# Patient Record
Sex: Female | Born: 1960 | Hispanic: No | Marital: Married | Smoking: Former smoker
Health system: Southern US, Community
[De-identification: ages and names within clinical notes are randomized; demographics above are authoritative.]

## PROBLEM LIST (undated history)

## (undated) DIAGNOSIS — E7800 Pure hypercholesterolemia, unspecified: Secondary | ICD-10-CM

## (undated) DIAGNOSIS — I10 Essential (primary) hypertension: Secondary | ICD-10-CM

## (undated) DIAGNOSIS — N189 Chronic kidney disease, unspecified: Secondary | ICD-10-CM

## (undated) HISTORY — PX: TUBAL LIGATION: SHX77

## (undated) HISTORY — DX: Pure hypercholesterolemia, unspecified: E78.00

## (undated) HISTORY — DX: Essential (primary) hypertension: I10

---

## 2000-03-16 ENCOUNTER — Other Ambulatory Visit: Admission: RE | Admit: 2000-03-16 | Discharge: 2000-03-16 | Payer: Self-pay | Admitting: Obstetrics and Gynecology

## 2001-03-19 ENCOUNTER — Other Ambulatory Visit: Admission: RE | Admit: 2001-03-19 | Discharge: 2001-03-19 | Payer: Self-pay | Admitting: Obstetrics and Gynecology

## 2005-12-22 ENCOUNTER — Other Ambulatory Visit: Admission: RE | Admit: 2005-12-22 | Discharge: 2005-12-22 | Payer: Self-pay | Admitting: Family Medicine

## 2007-01-02 ENCOUNTER — Other Ambulatory Visit: Admission: RE | Admit: 2007-01-02 | Discharge: 2007-01-02 | Payer: Self-pay | Admitting: Family Medicine

## 2007-11-27 ENCOUNTER — Inpatient Hospital Stay (HOSPITAL_COMMUNITY): Admission: AD | Admit: 2007-11-27 | Discharge: 2007-11-27 | Payer: Self-pay | Admitting: Obstetrics and Gynecology

## 2011-05-18 ENCOUNTER — Other Ambulatory Visit: Payer: Self-pay | Admitting: Family Medicine

## 2011-05-18 DIAGNOSIS — Z1231 Encounter for screening mammogram for malignant neoplasm of breast: Secondary | ICD-10-CM

## 2011-05-20 ENCOUNTER — Ambulatory Visit
Admission: RE | Admit: 2011-05-20 | Discharge: 2011-05-20 | Disposition: A | Discharge status: Home / Self Care | Payer: BC Managed Care – PPO | Source: Ambulatory Visit | Attending: Family Medicine | Admitting: Family Medicine

## 2011-05-20 DIAGNOSIS — Z1231 Encounter for screening mammogram for malignant neoplasm of breast: Secondary | ICD-10-CM

## 2011-07-08 LAB — URINE MICROSCOPIC-ADD ON

## 2011-07-08 LAB — DIFFERENTIAL
Band Neutrophils: 20 — ABNORMAL HIGH
Blasts: 0
Lymphocytes Relative: 8 — ABNORMAL LOW
Lymphs Abs: 1.6
Monocytes Absolute: 0.8
Monocytes Relative: 4
Neutro Abs: 13.7 — ABNORMAL HIGH
Neutrophils Relative %: 68
Promyelocytes Absolute: 0
nRBC: 0

## 2011-07-08 LAB — URINALYSIS, ROUTINE W REFLEX MICROSCOPIC
Glucose, UA: NEGATIVE
Specific Gravity, Urine: 1.02
pH: 6.5

## 2011-07-08 LAB — CBC
Hemoglobin: 12.1
Platelets: 237
RDW: 14

## 2011-07-08 LAB — URINE CULTURE

## 2011-12-01 ENCOUNTER — Other Ambulatory Visit: Payer: Self-pay | Admitting: Urology

## 2011-12-13 ENCOUNTER — Encounter (HOSPITAL_COMMUNITY): Payer: Self-pay | Admitting: *Deleted

## 2011-12-13 NOTE — Progress Notes (Signed)
Reminded no Aspirin products 4 days prior to procedure

## 2011-12-15 ENCOUNTER — Encounter (HOSPITAL_COMMUNITY): Payer: Self-pay | Admitting: Pharmacy Technician

## 2011-12-16 NOTE — H&P (Signed)
Chief Complaint     Left distal ureter stone.  History of Present Illness     51 year old female with a left distal 8 mm ureteral stone. This was discovered on CT scan 11/24/11.  The stone was visible on KUB 11/25/11.  She presents for left SWL.  We have discussed the risks, benefits, alternatives, and likelihood of achieving her goals. UA on 11/25/11 negative for signs of infection.  Past Medical History Problems  1. History of  No Medical Problems  Surgical History Problems  1. History of  Tubal Ligation V25.2  Current Meds 1. Multiple Vitamin TABS; Therapy: (Recorded:07Feb2013) to  Allergies Medication  1. Penicillins  Family History Problems  1. Paternal history of  Death In The Family Father 24- Renal failure 2. Family history of  Diabetes Mellitus V18.0 3. Paternal history of  Exposed To Chemical Contaminants - Dioxin 4. Maternal history of  Family Health Status - Mother's Age 83. Family history of  Family Health Status Number Of Children 1 son & 2 daughters 6. Paternal history of  Renal Failure  Social History Problems    Alcohol Use 3 on wk-ends   Caffeine Use 3 cups coffee daily   Former Smoker V15.82 2 yrs in college; not since 1984   Marital History - Currently Married   Occupation: support at [employer]  Review of Systems Constitutional, skin, eye, otolaryngeal, hematologic/lymphatic, cardiovascular, pulmonary, endocrine, musculoskeletal, gastrointestinal, neurological and psychiatric system(s) were reviewed and pertinent findings if present are noted.  Gastrointestinal: diarrhea and melena.     Physical Exam Constitutional: Well nourished and well developed.  ENT:. The ears and nose are normal in appearance. The oropharynx is normal.  Neck: The appearance of the neck is normal and no neck mass is present.  Pulmonary: No respiratory distress and normal respiratory rhythm and effort.  Cardiovascular: Heart rate and rhythm are normal . No  peripheral edema.  Abdomen: The abdomen is soft and nontender. The abdomen is no rebound. No CVA tenderness.  Genitourinary: Examination of the external genitalia shows normal female external genitalia and no vulvar atrophy. The urethra is normal in appearance. There is no urethral mass. Vaginal exam demonstrates no abnormalities, no atrophy and no discharge. No cystocele is identified. No rectocele is identified. The bladder is normal on palpation and non tender. The anus is normal on inspection. The perineum is normal on inspection. No perineal tenderness is present.  Lymphatics: The posterior cervical and supraclavicular nodes are not enlarged or tender.  Skin: Normal skin turgor and no visible rash.  Neuro/Psych:. Mood and affect are appropriate. No focal sensory deficits.     Assessment Left distal ureter   Plan     Proceed with left SWL.

## 2011-12-19 ENCOUNTER — Encounter (HOSPITAL_COMMUNITY): Payer: Self-pay | Admitting: *Deleted

## 2011-12-19 ENCOUNTER — Ambulatory Visit (HOSPITAL_COMMUNITY): Payer: BC Managed Care – PPO

## 2011-12-19 ENCOUNTER — Ambulatory Visit (HOSPITAL_COMMUNITY)
Admission: RE | Admit: 2011-12-19 | Discharge: 2011-12-19 | Disposition: A | Discharge status: Home / Self Care | Payer: BC Managed Care – PPO | Source: Ambulatory Visit | Attending: Urology | Admitting: Urology

## 2011-12-19 ENCOUNTER — Encounter (HOSPITAL_COMMUNITY)
Admission: RE | Disposition: A | Discharge status: Home / Self Care | Payer: Self-pay | Source: Ambulatory Visit | Attending: Urology

## 2011-12-19 DIAGNOSIS — N201 Calculus of ureter: Secondary | ICD-10-CM | POA: Insufficient documentation

## 2011-12-19 HISTORY — DX: Chronic kidney disease, unspecified: N18.9

## 2011-12-19 SURGERY — LITHOTRIPSY, ESWL
Anesthesia: LOCAL | Laterality: Left

## 2011-12-19 MED ORDER — CIPROFLOXACIN HCL 500 MG PO TABS
500.0000 mg | ORAL_TABLET | ORAL | Status: AC
  Administered 2011-12-19: 500 mg via ORAL

## 2011-12-19 MED ORDER — DEXTROSE-NACL 5-0.45 % IV SOLN
INTRAVENOUS | Status: DC
  Administered 2011-12-19: 15:00:00 via INTRAVENOUS

## 2011-12-19 MED ORDER — DIPHENHYDRAMINE HCL 25 MG PO CAPS
25.0000 mg | ORAL_CAPSULE | ORAL | Status: AC
  Administered 2011-12-19: 25 mg via ORAL

## 2011-12-19 MED ORDER — DIAZEPAM 5 MG PO TABS
10.0000 mg | ORAL_TABLET | ORAL | Status: AC
  Administered 2011-12-19: 10 mg via ORAL

## 2011-12-19 NOTE — Discharge Instructions (Signed)
DISCHARGE INSTRUCTIONS FOR STONES   MEDICATIONS:   1. DO NOT RESUME YOUR ASPIRIN, or any other medicines like ibuprofen, motrin, excedrin, advil, aleve, vitamin E, fish oil as these can all cause bleeding x 7 days.  ACTIVITY 1. No strenuous activity x 1 day. 2. No driving while on narcotic pain medications 3. Drink plenty of water 4. Continue to walk at home - you can still get blood clots when you are at home, so keep active, but don't over do it. 5. May return to work in 1 day.  BATHING 1. You can shower or take a bath.  SIGNS/SYMPTOMS TO CALL: 1. Please call us if you have a fever greater than 101.5, uncontrolled  nausea/vomiting, uncontrolled pain, dizziness, unable to urinate, chest pain, shortness of breath, leg swelling, leg pain, redness around wound, drainage from wound, or any other concerns or questions.  You can reach [place] at [phone].  Lithotripsy Care After Refer to this sheet for the next few weeks. These discharge instructions provide you with general information on caring for yourself after you leave the hospital. Your caregiver may also give you specific instructions. Your treatment has been planned according to the most current medical practices available, but unavoidable complications sometimes occur. If you have any problems or questions after discharge, please call your caregiver. AFTER THE PROCEDURE   The recovery time will vary with the procedure done.   You will be taken to the recovery area. A nurse will watch and check your progress. Once you are awake, stable, and taking fluids well, you will be allowed to go home as long as there are no problems.   Your urine may have a red tinge for a few days after treatment. Blood loss is usually minimal.   You may have soreness in the back or flank area. This usually goes away after a few days. The procedure can cause blotches or bruises on the back where the pressure wave enters the skin. These marks usually cause  only minimal discomfort and should disappear in a short time.   Stone fragments should begin to pass within 24 hours of treatment. However, a delayed passage is not unusual.   You may have pain, discomfort, and feel sick to your stomach (nauseous) when the crushed (pulverized) fragments of stone are passed down the tube from the kidney to the bladder. Stone fragments can pass soon after the procedure and may last for up to 4 to 8 weeks.   A small number of patients may have severe pain when stone fragments are not able to pass, which leads to an obstruction.   If your stone is greater than 1 inch/2.5 centimeters in diameter or if you have multiple stones that have a combined diameter greater than 1 inch/2.5 centimeters, you may require more than 1 treatment.   You must have someone drive you home.  HOME CARE INSTRUCTIONS   Rest at home until you feel your energy improving.   Only take over-the-counter or prescription medicines for pain, discomfort, or fever as directed by your caregiver. Depending on the type of lithotripsy, you may need to take medicines (antibiotics) that kill germs and anti-inflammatory medicines for a few days.   Drink enough water and fluids to keep your urine clear or pale yellow. This helps "flush" your kidneys. It helps pass any remaining pieces of stone and prevents stones from coming back.   Most people can resume daily activities within 1 or 2 days after standard lithotripsy. It can  take longer to recover from laser and percutaneous lithotripsy.   If the stones are in your urinary system, you may be asked to strain your urine at home to look for stones. Any stones that are found can be sent to a medical lab for examination.   Visit your caregiver for a follow-up appointment in a few weeks. Your doctor may remove your stent if you have one. Your caregiver will also check to see whether stone particles still remain.  SEEK MEDICAL CARE IF:   You have an oral  temperature above 102 F (38.9 C).   Your pain is not relieved by medicine.   You have a lasting nauseous feeling.   You feel there is too much blood in the urine.   You develop persistent problems with frequent and/or painful urination that does not at least partially improve after 2 days following the procedure.   You have a congested cough.   You feel lightheaded.   You develop a rash or any other signs that might suggest an allergic problem.   You develop any reaction or side effects to your medicine(s).  SEEK IMMEDIATE MEDICAL CARE IF:   You experience severe back and/or flank pain.   You see nothing but blood when you urinate.   You cannot pass any urine at all.   You have an oral temperature above 102 F (38.9 C), not controlled by medicine.   You develop shortness of breath, difficulty breathing, or chest pain.   You develop vomiting that will not stop after 6 to 8 hours.   You have a fainting episode.  MAKE SURE YOU:   Understand these instructions.   Will watch your condition.   Will get help right away if you are not doing well or get worse.  Document Released: 10/23/2007 Document Revised: 09/22/2011 Document Reviewed: 10/23/2007 [facility] Patient Information 2012 [place], [place].

## 2011-12-19 NOTE — Brief Op Note (Signed)
12/19/2011  3:52 PM  PATIENT:  Emily Lam  51 y.o. female  PRE-OPERATIVE DIAGNOSIS:  Left Distal Ureter Stone  POST-OPERATIVE DIAGNOSIS: Left distal ureter stone  PROCEDURE:  Procedure(s) (LRB): EXTRACORPOREAL SHOCK WAVE LITHOTRIPSY (ESWL) (Left)  SURGEON:  Surgeon(s) and Role:    * Milford Cage, MD - Primary  PHYSICIAN ASSISTANT:   ASSISTANTS: none   ANESTHESIA:   IV sedation  EBL:     BLOOD ADMINISTERED:none  DRAINS: none   LOCAL MEDICATIONS USED:  NONE  SPECIMEN:  No Specimen  DISPOSITION OF SPECIMEN:  N/A  COUNTS:  YES  TOURNIQUET:  * No tourniquets in log *  DICTATION: .Note written in paper chart  PLAN OF CARE: Discharge to home after PACU  PATIENT DISPOSITION:  PACU - hemodynamically stable.   Delay start of Pharmacological VTE agent (>24hrs) due to surgical blood loss or risk of bleeding: yes

## 2011-12-20 ENCOUNTER — Encounter (HOSPITAL_COMMUNITY): Payer: Self-pay

## 2014-10-17 HISTORY — PX: AUGMENTATION MAMMAPLASTY: SUR837

## 2014-10-17 HISTORY — PX: PLACEMENT OF BREAST IMPLANTS: SHX6334

## 2015-07-29 ENCOUNTER — Other Ambulatory Visit: Payer: Self-pay

## 2015-07-29 DIAGNOSIS — Z1231 Encounter for screening mammogram for malignant neoplasm of breast: Secondary | ICD-10-CM

## 2015-08-26 ENCOUNTER — Ambulatory Visit: Payer: BC Managed Care – PPO

## 2016-11-18 ENCOUNTER — Other Ambulatory Visit: Payer: Self-pay | Admitting: Family Medicine

## 2016-11-18 DIAGNOSIS — Z1231 Encounter for screening mammogram for malignant neoplasm of breast: Secondary | ICD-10-CM

## 2016-11-21 ENCOUNTER — Emergency Department (HOSPITAL_COMMUNITY)
Admission: EM | Admit: 2016-11-21 | Discharge: 2016-11-21 | Disposition: A | Discharge status: Home / Self Care | Payer: BLUE CROSS/BLUE SHIELD | Attending: Emergency Medicine | Admitting: Emergency Medicine

## 2016-11-21 ENCOUNTER — Encounter (HOSPITAL_COMMUNITY): Payer: Self-pay

## 2016-11-21 DIAGNOSIS — I10 Essential (primary) hypertension: Secondary | ICD-10-CM

## 2016-11-21 DIAGNOSIS — F419 Anxiety disorder, unspecified: Secondary | ICD-10-CM | POA: Diagnosis not present

## 2016-11-21 DIAGNOSIS — R9431 Abnormal electrocardiogram [ECG] [EKG]: Secondary | ICD-10-CM | POA: Diagnosis present

## 2016-11-21 DIAGNOSIS — I129 Hypertensive chronic kidney disease with stage 1 through stage 4 chronic kidney disease, or unspecified chronic kidney disease: Secondary | ICD-10-CM | POA: Insufficient documentation

## 2016-11-21 DIAGNOSIS — N189 Chronic kidney disease, unspecified: Secondary | ICD-10-CM | POA: Diagnosis not present

## 2016-11-21 DIAGNOSIS — R4589 Other symptoms and signs involving emotional state: Secondary | ICD-10-CM

## 2016-11-21 LAB — I-STAT TROPONIN, ED: TROPONIN I, POC: 0 ng/mL (ref 0.00–0.08)

## 2016-11-21 LAB — I-STAT CHEM 8, ED
BUN: 13 mg/dL (ref 6–20)
CREATININE: 0.6 mg/dL (ref 0.44–1.00)
Calcium, Ion: 1.12 mmol/L — ABNORMAL LOW (ref 1.15–1.40)
Chloride: 107 mmol/L (ref 101–111)
Glucose, Bld: 100 mg/dL — ABNORMAL HIGH (ref 65–99)
HEMATOCRIT: 48 % — AB (ref 36.0–46.0)
HEMOGLOBIN: 16.3 g/dL — AB (ref 12.0–15.0)
POTASSIUM: 3.8 mmol/L (ref 3.5–5.1)
Sodium: 143 mmol/L (ref 135–145)
TCO2: 27 mmol/L (ref 0–100)

## 2016-11-21 NOTE — ED Provider Notes (Signed)
[facility]-EMERGENCY DEPT Provider Note   CSN: [id number] Arrival date & time: 11/21/16  1245     History   Chief Complaint Chief Complaint  Patient presents with  . Abnormal ECG    HPI Emily Lam is a 56 y.o. female with no significant PMH. Today, she presented to the urgent care with a self-measured blood pressure of 200/90 after telling a co-worker about stressful events of the weekend. There, an EKG was done which apparently showed some non-specific changes that concerned the provider, so the pt was sent here to the ED via EMS. Pt describes increased psychosocial stress for the past couple of weeks with associated transient increases in her BP during that time. Pt denies headache, syncope/pre-syncope, visual disturbances, recent infection, chest pain, palpitations, dyspnea, orthopnea, DOE, and PND.  HPI  Past Medical History:  Diagnosis Date  . Chronic kidney disease    left distal ureter stone    There are no active problems to display for this patient.   Past Surgical History:  Procedure Laterality Date  . AUGMENTATION MAMMAPLASTY     20 yr ago  . TUBAL LIGATION      OB History    No data available       Home Medications    Prior to Admission medications   Not on File    Family History History reviewed. No pertinent family history.  Social History Social History  Substance Use Topics  . Smoking status: Never Smoker  . Smokeless tobacco: Never Used  . Alcohol use Yes     Allergies   Penicillins   Review of Systems Review of Systems  Ten systems reviewed and are negative for acute change, except as noted in the HPI.   Physical Exam Updated Vital Signs BP 165/86   Pulse 90   Temp 98.7 F (37.1 C) (Oral)   Resp 12   SpO2 95%   Physical Exam  Constitutional: She is oriented to person, place, and time. She appears well-developed and well-nourished. No distress.  HENT:  Head: Normocephalic and atraumatic.  Eyes: Conjunctivae are normal.  No scleral icterus.  Neck: Normal range of motion.  Cardiovascular: Normal rate, regular rhythm and normal heart sounds.  Exam reveals no gallop and no friction rub.   No murmur heard. Pulmonary/Chest: Effort normal and breath sounds normal. No respiratory distress.  Abdominal: Soft. Bowel sounds are normal. She exhibits no distension and no mass. There is no tenderness. There is no guarding.  Neurological: She is alert and oriented to person, place, and time.  Skin: Skin is warm and dry. She is not diaphoretic.  Nursing note and vitals reviewed.    ED Treatments / Results  Labs (all labs ordered are listed, but only abnormal results are displayed) Labs Reviewed  I-STAT CHEM 8, ED - Abnormal; Notable for the following:       Result Value   Glucose, Bld 100 (*)    Calcium, Ion 1.12 (*)    Hemoglobin 16.3 (*)    HCT 48.0 (*)    All other components within normal limits  I-STAT TROPOININ, ED    EKG  EKG Interpretation  Date/Time:  Monday November 21 2016 12:55:50 EST Ventricular Rate:  91 PR Interval:  134 QRS Duration: 74 QT Interval:  362 QTC Calculation: 445 R Axis:   87 Text Interpretation:  Normal sinus rhythm Possible Left atrial enlargement Left ventricular hypertrophy with repolarization abnormality Abnormal ECG Confirmed by Fayrene Fearing  MD, MARK ([id number]) on 11/21/2016 3:15:16 PM  Radiology No results found.  Procedures Procedures (including critical care time)  Medications Ordered in ED Medications - No data to display   Initial Impression / Assessment and Plan / ED Course  I have reviewed the triage vital signs and the nursing notes.  Pertinent labs & imaging results that were available during my care of the patient were reviewed by me and considered in my medical decision making (see chart for details).     Patient noted to be hypertensive in the emergency department.  No signs of hypertensive urgency.  Discussed with patient the need for close follow-up  and management by their primary care physician.    Final Clinical Impressions(s) / ED Diagnoses   Final diagnoses:  Hypertension, unspecified type  Feeling anxious    New Prescriptions New Prescriptions   No medications on file     Arthor Captainbigail Cait Locust, PA-C 11/21/16 1537    Rolland PorterMark James, MD 12/03/16 2259

## 2016-11-21 NOTE — ED Triage Notes (Signed)
Pt from doctors office for high blood pressure and abnormal EKG. Pt has no pain and ha not had any pain the whole time. Pt states she is relaxed now.

## 2016-11-21 NOTE — Discharge Instructions (Signed)
Contact a health care provider if: You think you are having a reaction to medicines taken. You have recurrent headaches or feel dizzy. You have swelling in your ankles. You have trouble with your vision. Get help right away if: You develop a severe headache or confusion. You have unusual weakness, numbness, or feel faint. You have severe chest or abdominal pain. You vomit repeatedly. You have trouble breathing.

## 2016-11-23 ENCOUNTER — Ambulatory Visit
Admission: RE | Admit: 2016-11-23 | Discharge: 2016-11-23 | Disposition: A | Discharge status: Home / Self Care | Payer: BLUE CROSS/BLUE SHIELD | Source: Ambulatory Visit | Attending: Family Medicine | Admitting: Family Medicine

## 2016-11-23 DIAGNOSIS — Z1231 Encounter for screening mammogram for malignant neoplasm of breast: Secondary | ICD-10-CM

## 2017-04-11 ENCOUNTER — Ambulatory Visit (INDEPENDENT_AMBULATORY_CARE_PROVIDER_SITE_OTHER): Payer: BLUE CROSS/BLUE SHIELD | Admitting: Neurology

## 2017-04-11 ENCOUNTER — Encounter: Payer: Self-pay | Admitting: Neurology

## 2017-04-11 VITALS — BP 157/83 | HR 93 | Ht 66.0 in | Wt 131.4 lb

## 2017-04-11 DIAGNOSIS — IMO0001 Reserved for inherently not codable concepts without codable children: Secondary | ICD-10-CM

## 2017-04-11 DIAGNOSIS — R209 Unspecified disturbances of skin sensation: Secondary | ICD-10-CM

## 2017-04-11 NOTE — Patient Instructions (Signed)

## 2017-04-11 NOTE — Progress Notes (Signed)
[facility]    Provider:  Dr Lucia Gaskins Referring Provider: Jesse Fall Primary Care Physician:  Sigmund Hazel, MD, Marcelo Baldy, PA-C  CC:  numbness  HPI:  Emily Lam is a 56 y.o. female here as a referral from Dr. Damaris Schooner for paresthesias. Past medical history of UTI and kidney stones and paresthesia, stress, hypertension, anxiety, hyperlipidemia, panic attacks. Per a review of primary care records: Patient has tingling in the back of the arms back, legs which comes and goes. Reaction from hydrochlorothiazide which started February 2018 and stopped the patient is also extremely stressed. Patient has recent stress in her life including her mom who is unwell. Exam and primary care office was normal including general, eyes, head, ears, nose, throat, neck, lungs, heart, extremities, neurologic.  Patient is here alone and reports she has had high blood pressure and she has anxiety and white coat syndrome and diagnosed with high BP due to stress. She started eating well and walking. She was walking and she would feel stress in the top middle of her back, walking helped with stress and sleeping. She started walking twice a week. She started watching with better posture and it felt better. She had a massage a week ago and there were lots of knots and she has "overworked something". She does lift packages during the day. Her back started tingling in the middle of march all over in the toe or lip or anywhere on the body felt like a "ping". Symptoms would happen occasionally in the beginning. Now she has some tingling maybe daily, she has some feeling of her lip and leg going numb, more on the left side not on the rgiht. It is random. No ain, split second feeling or a "swoosh" down her arms. Also in her face. The symptoms are not going away. Strecthing and brething helps and she has spome back muscular tightness on the left side of her thorax. No weakness, no vision changes, no  dizziness, no headaches, no fevers, no rash no other associated symptoms. No other focal neurologic deficits, associated symptoms, inciting events or modifiable factors. She has been very stressed and worried.   Reviewed notes, labs and imaging from outside physicians, which showed:   TSH 4.43, CMP unremarkable with BUN 8 and creatinine 0.68, vitamin B12 was 684 labs were drawn 03/27/2017  Review of Systems: Patient complains of symptoms per HPI as well as the following symptoms: no CP, no SOB, . Pertinent negatives and positives per HPI. All others negative.   Social History   Social History  . Marital status: Married    Spouse name: N/A  . Number of children: 3  . Years of education: 70   Occupational History  . [employer]    Social History Main Topics  . Smoking status: Never Smoker  . Smokeless tobacco: Never Used  . Alcohol use Yes     Comment: Up to 6 drinks on the weekend  . Drug use: No  . Sexual activity: Not on file   Other Topics Concern  . Not on file   Social History Narrative   Lives at home w/ her husband   Right-handed   Caffeine: 2 cups of coffee per day    Family History  Problem Relation Age of Onset  . Diabetes Father        Agent orange  . Neuropathy Father   . Breast cancer Maternal Grandmother     Past Medical History:  Diagnosis Date  .  Chronic kidney disease    left distal ureter stone  . High cholesterol   . Hypertension     Past Surgical History:  Procedure Laterality Date  . AUGMENTATION MAMMAPLASTY Bilateral 10/2014   20 yr ago Replaced with saline two years ago Jan of 2016  . PLACEMENT OF BREAST IMPLANTS Bilateral 10/2014   Behind the mucsle implants done in 10/2014 which replaced the In front of the muscle silicone gel implants done in March of 1991  . TUBAL LIGATION      Current Outpatient Prescriptions  Medication Sig Dispense Refill  . Cyanocobalamin (B-12 PO) Take by mouth daily.    Marland Kitchen loratadine (CLARITIN) 10 MG  tablet Take 10 mg by mouth daily.    Marland Kitchen ALPRAZolam (XANAX) 0.25 MG tablet TAKE 1 TABLET AS NEEDED TWICE A DAY  0   No current facility-administered medications for this visit.     Allergies as of 04/11/2017  . (No Known Allergies)    Vitals: BP (!) 157/83   Pulse 93   Ht 5\' 6"  (1.676 m)   Wt 131 lb 6.4 oz (59.6 kg)   BMI 21.21 kg/m  Last Weight:  Wt Readings from Last 1 Encounters:  04/11/17 131 lb 6.4 oz (59.6 kg)   Last Height:   Ht Readings from Last 1 Encounters:  04/11/17 5\' 6"  (1.676 m)    Physical exam: Exam: Gen: NAD, conversant, well nourised, well groomed                     CV: RRR, no MRG. No Carotid Bruits. No peripheral edema, warm, nontender Eyes: Conjunctivae clear without exudates or hemorrhage  Neuro: Detailed Neurologic Exam  Speech:    Speech is normal; fluent and spontaneous with normal comprehension.  Cognition:    The patient is oriented to person, place, and time;     recent and remote memory intact;     language fluent;     normal attention, concentration,     fund of knowledge Cranial Nerves:    The pupils are equal, round, and reactive to light. The fundi are normal and spontaneous venous pulsations are present. Visual fields are full to finger confrontation. Extraocular movements are intact. Trigeminal sensation is intact and the muscles of mastication are normal. The face is symmetric. The palate elevates in the midline. Hearing intact. Voice is normal. Shoulder shrug is normal. The tongue has normal motion without fasciculations.   Coordination:    Normal finger to nose and heel to shin. Normal rapid alternating movements.   Gait:    Heel-toe and tandem gait are normal.   Motor Observation:    No asymmetry, no atrophy, and no involuntary movements noted. Tone:    Normal muscle tone.    Posture:    Posture is normal. normal erect    Strength:    Strength is V/V in the upper and lower limbs.      Sensation: intact to LT       Reflex Exam:  DTR's:    Deep tendon reflexes in the upper and lower extremities are normal bilaterally.   Toes:    The toes are downgoing bilaterally.   Clonus:    Clonus is absent.      Assessment/Plan:   56 y.o. female here as a referral from Dr. Damaris Schooner for paresthesias in the setting of stress and panic attacks, anxiety. Past medical history of UTI and kidney stones and paresthesia, stress, hypertension, anxiety, hyperlipidemia, panic attacks. She  has random paresthesias more on her left side but all over the body. My clinical suspicion is that this is benign possible due to stress or musculoskeletal. She is quite anxious and panicked and stressed. Offered MRi brain and emg/ncs but these may be unnecessary testing however can't be entirely positive. If the symptoms continue I suggest further workup otherwise follow clinically. Recommend f/u with therapy or psychiatry for her stress and anxiety.   Cc: Marcelo BaldyMauney, Jessica S, PA-C  Naomie DeanAntonia Jule Schlabach, MD  [facility] [address] [place] [id number]  Phone [phone] Fax [phone]

## 2018-07-04 DIAGNOSIS — E7800 Pure hypercholesterolemia, unspecified: Secondary | ICD-10-CM | POA: Diagnosis not present

## 2018-07-04 DIAGNOSIS — Z0000 Encounter for general adult medical examination without abnormal findings: Secondary | ICD-10-CM | POA: Diagnosis not present

## 2018-07-04 DIAGNOSIS — Z23 Encounter for immunization: Secondary | ICD-10-CM | POA: Diagnosis not present

## 2018-07-04 DIAGNOSIS — Z1211 Encounter for screening for malignant neoplasm of colon: Secondary | ICD-10-CM | POA: Diagnosis not present

## 2018-07-04 DIAGNOSIS — R030 Elevated blood-pressure reading, without diagnosis of hypertension: Secondary | ICD-10-CM | POA: Diagnosis not present

## 2018-08-22 DIAGNOSIS — Z1231 Encounter for screening mammogram for malignant neoplasm of breast: Secondary | ICD-10-CM | POA: Diagnosis not present

## 2018-08-22 DIAGNOSIS — Z01419 Encounter for gynecological examination (general) (routine) without abnormal findings: Secondary | ICD-10-CM | POA: Diagnosis not present

## 2018-08-22 DIAGNOSIS — Z6822 Body mass index (BMI) 22.0-22.9, adult: Secondary | ICD-10-CM | POA: Diagnosis not present

## 2018-11-17 DIAGNOSIS — R21 Rash and other nonspecific skin eruption: Secondary | ICD-10-CM | POA: Diagnosis not present

## 2018-11-17 DIAGNOSIS — B029 Zoster without complications: Secondary | ICD-10-CM | POA: Diagnosis not present

## 2018-11-20 DIAGNOSIS — R21 Rash and other nonspecific skin eruption: Secondary | ICD-10-CM | POA: Diagnosis not present

## 2019-06-26 ENCOUNTER — Emergency Department (HOSPITAL_COMMUNITY)
Admission: EM | Admit: 2019-06-26 | Discharge: 2019-06-26 | Disposition: A | Discharge status: Home / Self Care | Payer: BC Managed Care – PPO | Attending: Emergency Medicine | Admitting: Emergency Medicine

## 2019-06-26 ENCOUNTER — Other Ambulatory Visit: Payer: Self-pay

## 2019-06-26 ENCOUNTER — Encounter (HOSPITAL_COMMUNITY): Payer: Self-pay

## 2019-06-26 ENCOUNTER — Emergency Department (HOSPITAL_COMMUNITY): Payer: BC Managed Care – PPO

## 2019-06-26 DIAGNOSIS — G454 Transient global amnesia: Secondary | ICD-10-CM | POA: Insufficient documentation

## 2019-06-26 DIAGNOSIS — I129 Hypertensive chronic kidney disease with stage 1 through stage 4 chronic kidney disease, or unspecified chronic kidney disease: Secondary | ICD-10-CM | POA: Diagnosis not present

## 2019-06-26 DIAGNOSIS — N189 Chronic kidney disease, unspecified: Secondary | ICD-10-CM | POA: Diagnosis not present

## 2019-06-26 DIAGNOSIS — R413 Other amnesia: Secondary | ICD-10-CM | POA: Diagnosis not present

## 2019-06-26 DIAGNOSIS — R4182 Altered mental status, unspecified: Secondary | ICD-10-CM | POA: Diagnosis not present

## 2019-06-26 DIAGNOSIS — Z79899 Other long term (current) drug therapy: Secondary | ICD-10-CM | POA: Diagnosis not present

## 2019-06-26 DIAGNOSIS — I10 Essential (primary) hypertension: Secondary | ICD-10-CM

## 2019-06-26 LAB — CBC
HCT: 46.9 % — ABNORMAL HIGH (ref 36.0–46.0)
Hemoglobin: 15.2 g/dL — ABNORMAL HIGH (ref 12.0–15.0)
MCH: 30.3 pg (ref 26.0–34.0)
MCHC: 32.4 g/dL (ref 30.0–36.0)
MCV: 93.6 fL (ref 80.0–100.0)
Platelets: 300 10*3/uL (ref 150–400)
RBC: 5.01 MIL/uL (ref 3.87–5.11)
RDW: 12.3 % (ref 11.5–15.5)
WBC: 7.9 10*3/uL (ref 4.0–10.5)
nRBC: 0 % (ref 0.0–0.2)

## 2019-06-26 LAB — DIFFERENTIAL
Abs Immature Granulocytes: 0.01 10*3/uL (ref 0.00–0.07)
Basophils Absolute: 0 10*3/uL (ref 0.0–0.1)
Basophils Relative: 0 %
Eosinophils Absolute: 0 10*3/uL (ref 0.0–0.5)
Eosinophils Relative: 0 %
Immature Granulocytes: 0 %
Lymphocytes Relative: 13 %
Lymphs Abs: 1 10*3/uL (ref 0.7–4.0)
Monocytes Absolute: 0.4 10*3/uL (ref 0.1–1.0)
Monocytes Relative: 5 %
Neutro Abs: 6.5 10*3/uL (ref 1.7–7.7)
Neutrophils Relative %: 82 %

## 2019-06-26 LAB — I-STAT CHEM 8, ED
BUN: 13 mg/dL (ref 6–20)
Calcium, Ion: 1.14 mmol/L — ABNORMAL LOW (ref 1.15–1.40)
Chloride: 104 mmol/L (ref 98–111)
Creatinine, Ser: 0.7 mg/dL (ref 0.44–1.00)
Glucose, Bld: 106 mg/dL — ABNORMAL HIGH (ref 70–99)
HCT: 48 % — ABNORMAL HIGH (ref 36.0–46.0)
Hemoglobin: 16.3 g/dL — ABNORMAL HIGH (ref 12.0–15.0)
Potassium: 3.4 mmol/L — ABNORMAL LOW (ref 3.5–5.1)
Sodium: 140 mmol/L (ref 135–145)
TCO2: 26 mmol/L (ref 22–32)

## 2019-06-26 LAB — COMPREHENSIVE METABOLIC PANEL
ALT: 21 U/L (ref 0–44)
AST: 23 U/L (ref 15–41)
Albumin: 4.3 g/dL (ref 3.5–5.0)
Alkaline Phosphatase: 115 U/L (ref 38–126)
Anion gap: 10 (ref 5–15)
BUN: 11 mg/dL (ref 6–20)
CO2: 27 mmol/L (ref 22–32)
Calcium: 9.4 mg/dL (ref 8.9–10.3)
Chloride: 103 mmol/L (ref 98–111)
Creatinine, Ser: 0.77 mg/dL (ref 0.44–1.00)
GFR calc Af Amer: >60 mL/min (ref >60)
GFR calc non Af Amer: >60 mL/min (ref >60)
Glucose, Bld: 110 mg/dL — ABNORMAL HIGH (ref 70–99)
Potassium: 3.4 mmol/L — ABNORMAL LOW (ref 3.5–5.1)
Sodium: 140 mmol/L (ref 135–145)
Total Bilirubin: 0.5 mg/dL (ref 0.3–1.2)
Total Protein: 7.4 g/dL (ref 6.5–8.1)

## 2019-06-26 LAB — I-STAT BETA HCG BLOOD, ED (MC, WL, AP ONLY): I-stat hCG, quantitative: <5 m[IU]/mL (ref <5)

## 2019-06-26 LAB — PROTIME-INR
INR: 1 (ref 0.8–1.2)
Prothrombin Time: 12.6 seconds (ref 11.4–15.2)

## 2019-06-26 LAB — APTT: aPTT: 25 seconds (ref 24–36)

## 2019-06-26 NOTE — ED Provider Notes (Signed)
[facility] Provider [facility]   CSN: [id number] Arrival date & time: 06/26/19  1019     History   Chief Complaint Chief Complaint  Patient presents with  . Altered Mental Status    HPI Emily Lam is a 58 y.o. female.  She has a history of hypertension hypercholesterolemia.  She is brought in by her husband for period of time of memory loss today.  He said sometime around 7 AM till around 10 or 11 AM she was acting very odd.  She could remember things that they had done and at work she forgot to use her mask and could not remember that she was at work.  It sounds like she face times with her daughter and has no recollection of that.  No headache numbness weakness blurry vision nausea vomiting.  No recent illness.  No prior history of spells like this and no prior history of head injury or seizures.  She feels back to baseline now.     The history is provided by the patient.  Altered Mental Status Presenting symptoms: memory loss   Severity:  Moderate Most recent episode:  Today Episode history:  Single Duration:  3 hours Progression:  Resolved Chronicity:  New Context: not alcohol use, not drug use, not head injury, not homeless, not recent change in medication, not recent illness and not recent infection   Associated symptoms: no abdominal pain, no bladder incontinence, no difficulty breathing, no fever, no headaches, no nausea, no rash, no seizures, no slurred speech, no visual change, no vomiting and no weakness     Past Medical History:  Diagnosis Date  . Chronic kidney disease    left distal ureter stone  . High cholesterol   . Hypertension     There are no active problems to display for this patient.   Past Surgical History:  Procedure Laterality Date  . AUGMENTATION MAMMAPLASTY Bilateral 10/2014   20 yr ago Replaced with saline two years ago Jan of 2016  . PLACEMENT OF BREAST IMPLANTS Bilateral 10/2014   Behind the mucsle  implants done in 10/2014 which replaced the In front of the muscle silicone gel implants done in March of 1991  . TUBAL LIGATION       OB History   No obstetric history on file.      Home Medications    Prior to Admission medications   Medication Sig Start Date End Date Taking? Authorizing Provider  ALPRAZolam Prudy Feeler(XANAX) 0.25 MG tablet TAKE 1 TABLET AS NEEDED TWICE A DAY 03/27/17   [provider]  Cyanocobalamin (B-12 PO) Take by mouth daily.    [provider]  loratadine (CLARITIN) 10 MG tablet Take 10 mg by mouth daily.    [provider]    Family History Family History  Problem Relation Age of Onset  . Diabetes Father        Agent orange  . Neuropathy Father   . Breast cancer Maternal Grandmother     Social History Social History   Tobacco Use  . Smoking status: Never Smoker  . Smokeless tobacco: Never Used  Substance Use Topics  . Alcohol use: Yes    Comment: Up to 6 drinks on the weekend  . Drug use: No     Allergies   Patient has no known allergies.   Review of Systems Review of Systems  Constitutional: Negative for fever.  HENT: Negative for sore throat.   Eyes: Negative for visual disturbance.  Respiratory: Negative for shortness of breath.   Cardiovascular: Negative for chest pain.  Gastrointestinal: Negative for abdominal pain, nausea and vomiting.  Genitourinary: Negative for bladder incontinence and dysuria.  Musculoskeletal: Negative for neck pain.  Skin: Negative for rash.  Neurological: Negative for seizures, weakness and headaches.  Psychiatric/Behavioral: Positive for memory loss.     Physical Exam Updated Vital Signs BP (!) 198/86   Pulse 98   Temp 98.7 F (37.1 C) (Oral)   Resp 15   Ht 5\' 6"  (1.676 m)   Wt 65.8 kg   SpO2 97%   BMI 23.40 kg/m   Physical Exam Vitals signs and nursing note reviewed.  Constitutional:      General: She is not in acute distress.    Appearance: She is well-developed.   HENT:     Head: Normocephalic and atraumatic.  Eyes:     Conjunctiva/sclera: Conjunctivae normal.  Neck:     Musculoskeletal: Neck supple.  Cardiovascular:     Rate and Rhythm: Normal rate and regular rhythm.     Heart sounds: No murmur.  Pulmonary:     Effort: Pulmonary effort is normal. No respiratory distress.     Breath sounds: Normal breath sounds.  Abdominal:     Palpations: Abdomen is soft.     Tenderness: There is no abdominal tenderness.  Musculoskeletal: Normal range of motion.     Right lower leg: No edema.     Left lower leg: No edema.  Skin:    General: Skin is warm and dry.     Capillary Refill: Capillary refill takes less than 2 seconds.  Neurological:     General: No focal deficit present.     Mental Status: She is alert and oriented to person, place, and time.     Cranial Nerves: No cranial nerve deficit.     Sensory: No sensory deficit.     Motor: No weakness.     Coordination: Coordination normal.     Gait: Gait normal.      ED Treatments / Results  Labs (all labs ordered are listed, but only abnormal results are displayed) Labs Reviewed  CBC - Abnormal; Notable for the following components:      Result Value   Hemoglobin 15.2 (*)    HCT 46.9 (*)    All other components within normal limits  COMPREHENSIVE METABOLIC PANEL - Abnormal; Notable for the following components:   Potassium 3.4 (*)    Glucose, Bld 110 (*)    All other components within normal limits  I-STAT CHEM 8, ED - Abnormal; Notable for the following components:   Potassium 3.4 (*)    Glucose, Bld 106 (*)    Calcium, Ion 1.14 (*)    Hemoglobin 16.3 (*)    HCT 48.0 (*)    All other components within normal limits  PROTIME-INR  APTT  DIFFERENTIAL  I-STAT BETA HCG BLOOD, ED ([facility], [facility], [facility] ONLY)  CBG MONITORING, ED    EKG EKG Interpretation  Date/Time:  Wednesday June 26 2019 17:22:19 EDT Ventricular Rate:  92 PR Interval:    QRS Duration: 69 QT Interval:  362 QTC  Calculation: 448 R Axis:   82 Text Interpretation:  Sinus rhythm Atrial premature complex Left ventricular hypertrophy Anterior Q waves, possibly due to LVH Repol abnrm suggests ischemia, diffuse leads similar to prior 2/18 Confirmed by Aletta Edouard [phone]) on 06/26/2019 5:30:41 PM   Radiology Ct Head Wo Contrast  Result Date: 06/26/2019 CLINICAL DATA:  58 year old female  with abrupt onset memory loss today. EXAM: CT HEAD WITHOUT CONTRAST TECHNIQUE: Contiguous axial images were obtained from the base of the skull through the vertex without intravenous contrast. COMPARISON:  None. FINDINGS: Brain: Cerebral volume is within normal limits for age. No midline shift, ventriculomegaly, mass effect, evidence of mass lesion, intracranial hemorrhage or evidence of cortically based acute infarction. Gray-white matter differentiation is within normal limits throughout the brain. Vascular: The distal right vertebral artery appears dominant. No suspicious intracranial vascular hyperdensity. Skull: Negative. Sinuses/Orbits: Visualized paranasal sinuses and mastoids are clear. Other: Visualized orbits and scalp soft tissues are within normal limits. IMPRESSION: Normal non contrast head CT. Electronically Signed   By: Odessa Fleming M.D.   On: 06/26/2019 16:23    Procedures Procedures (including critical care time)  Medications Ordered in ED Medications - No data to display   Initial Impression / Assessment and Plan / ED Course  I have reviewed the triage vital signs and the nursing notes.  Pertinent labs & imaging results that were available during my care of the patient were reviewed by me and considered in my medical decision making (see chart for details).  Clinical Course as of Jun 26 2127  Wed Jun 26, 2019  1638 Patient here with period of memory loss.  Differential diagnosis includes stroke, TGA, seizures, metabolic derangement, hypertensive emergency.   [MB]  1825 Patient's head CT and lab work is been  fairly unremarkable.  Blood pressure has come down somewhat here.  She said her blood pressure has been an issue and she has an appointment with her PCP for this.  I reviewed the case with Dr. Laurence Slate from neurology.  He reviewed her CT and felt that she can be discharged to follow-up outpatient neurology in 2 to 4 weeks with an outpatient EEG.  I reviewed this with the patient and her husband and they are comfortable with the plan.  They understand return if any worsening symptoms.   [MB]    Clinical Course User Index [MB] Terrilee Files, MD   Yilin Wallo was evaluated in Emergency Department on 06/26/2019 for the symptoms described in the history of present illness. She was evaluated in the context of the global COVID-19 pandemic, which necessitated consideration that the patient might be at risk for infection with the SARS-CoV-2 virus that causes COVID-19. Institutional protocols and algorithms that pertain to the evaluation of patients at risk for COVID-19 are in a state of rapid change based on information released by regulatory bodies including the CDC and federal and state organizations. These policies and algorithms were followed during the patient's care in the ED.      Final Clinical Impressions(s) / ED Diagnoses   Final diagnoses:  TGA (transient global amnesia)  Essential hypertension    ED Discharge Orders    None       Terrilee Files, MD 06/26/19 2129

## 2019-06-26 NOTE — Discharge Instructions (Addendum)
You were seen in the emergency department for evaluation of a period of memory loss today.  You had blood work and a CAT scan that did not show any serious findings.  We reviewed this with neurology and they recommend that you follow-up in the neurology clinic in the next 2 to 4 weeks.  We have placed a referral for this.  Your blood pressure is also elevated and you will need to have this followed up with your primary care doctor.  Please return to the emergency department if any concerning symptoms.

## 2019-06-26 NOTE — ED Triage Notes (Signed)
Pt arrives POV for eval of "memory loss". Pt reports her husband brought her to the ED because she has no recollection of anything that has happened today. Husband reports that she was cutting limbs on a tree this AM and she looked at him while doing that and looked at him and said "what are we doing? Why are we doing this?".

## 2019-07-30 ENCOUNTER — Encounter: Payer: Self-pay | Admitting: Neurology

## 2019-07-30 ENCOUNTER — Ambulatory Visit: Payer: BC Managed Care – PPO | Admitting: Neurology

## 2019-07-30 ENCOUNTER — Other Ambulatory Visit: Payer: Self-pay

## 2019-07-30 VITALS — BP 194/80 | HR 81 | Temp 97.2°F | Ht 66.0 in | Wt 137.0 lb

## 2019-07-30 DIAGNOSIS — G454 Transient global amnesia: Secondary | ICD-10-CM | POA: Diagnosis not present

## 2019-07-30 DIAGNOSIS — E7800 Pure hypercholesterolemia, unspecified: Secondary | ICD-10-CM | POA: Insufficient documentation

## 2019-07-30 DIAGNOSIS — G9340 Encephalopathy, unspecified: Secondary | ICD-10-CM | POA: Diagnosis not present

## 2019-07-30 DIAGNOSIS — G459 Transient cerebral ischemic attack, unspecified: Secondary | ICD-10-CM

## 2019-07-30 DIAGNOSIS — F419 Anxiety disorder, unspecified: Secondary | ICD-10-CM

## 2019-07-30 DIAGNOSIS — R404 Transient alteration of awareness: Secondary | ICD-10-CM

## 2019-07-30 DIAGNOSIS — I10 Essential (primary) hypertension: Secondary | ICD-10-CM | POA: Insufficient documentation

## 2019-07-30 MED ORDER — ALPRAZOLAM 0.25 MG PO TABS: ORAL_TABLET | ORAL | 0 refills | Status: AC

## 2019-07-30 NOTE — Patient Instructions (Addendum)
MRI of the brain CT scan of the blood vessels of the head and neck Echocardiogram EEG   Transient Global Amnesia Transient global amnesia causes a sudden and temporary (transient) loss of memory (amnesia). You may recall memories from your distant past and people you know well. However, you may not recall things that happened more recently in the past days, months, or even year. A transient global amnesia episode does not last longer than 24 hours. Transient global amnesia does not affect your other brain functions. Your memory usually returns to normal after an episode is over. One episode of transient global amnesia does not make you more likely to have a stroke, a relapse, or other complications. What are the causes? The cause of this condition is not known. Certain activities have been reported to trigger transient global amnesia. These activities include:  Swimming in very cold or hot water.  Sexual intercourse.  Emotional distress, such as receiving bad news or having a lot of stress at once.  Strenuous exercise or activity. What increases the risk? You are more likely to develop this condition if:  You are 6-76 years old.  You have a history of migraine headaches. What are the signs or symptoms? The main symptoms of this condition include:  Being unable to remember recent events.  Asking repetitive questions about a situation and surroundings and not recalling the answers to these questions. Other symptoms include:  Restlessness and nervousness.  Confusion.  Headaches.  Dizziness.  Nausea. How is this diagnosed? This condition may be diagnosed based on:  Your symptoms.  A physical exam.  A test to check your mental abilities (cognitive evaluation).  Imaging studies to check brain function. These may include: ? Electroencephalogram (EEG). This test checks the brain's electrical activity. ? CT scan. ? MRI. How is this treated? There is no treatment for  this condition. An episode typically goes away on its own after a few hours. You may receive medicines to treat other conditions, such as a migraine. Follow these instructions at home:  Take over-the-counter and prescription medicines only as told by your health care provider.  Avoid taking medicines that can affect thinking, such as pain or sleeping medicines.  Learn what activities may trigger an episode. Avoid these activities as told by your health care provider.  Find ways to manage stress, such as meditation or yoga.  Keep all follow-up visits as told by your health care provider. This is important. Contact a health care provider if you:  Have a migraine that does not go away.  Experience transient global amnesia repeatedly. Get help right away if you:  Have a seizure. Summary  Transient global amnesia causes a sudden and temporary (transient) loss of memory (amnesia).  There is no treatment for this condition. An episode typically goes away on its own after a few hours.  You may receive medicines to treat other conditions, such as a migraine.  Transient global amnesia does not affect your other brain functions. Your memory usually returns to normal after an episode is over. This information is not intended to replace advice given to you by your health care provider. Make sure you discuss any questions you have with your health care provider. Document Released: 11/10/2004 Document Revised: 10/18/2017 Document Reviewed: 10/18/2017 Elsevier Patient Education  [place].   Transient Ischemic Attack A transient ischemic attack (TIA) is a "warning stroke" that causes stroke-like symptoms that then go away quickly. The symptoms of a TIA come on suddenly,  and they last less than 24 hours. Unlike a stroke, a TIA does not cause permanent damage to the brain. It is important to know the symptoms of a TIA and what to do. Seek medical care right away, even if your symptoms go  away. Having a TIA is a sign that you are at higher risk for a permanent stroke. Lifestyle changes and medical treatments can help prevent a stroke. What are the causes? This condition is caused by a temporary blockage in an artery in the head or neck. The blockage does not allow the brain to get the blood supply it needs and can cause various symptoms. The blockage can be caused by:  Fatty buildup in an artery in the head or neck (atherosclerosis).  A blood clot.  Tearing of an artery (dissection).  Inflammation of an artery (vasculitis). Sometimes the cause is not known. What increases the risk? Certain factors may make you more likely to develop this condition. Some of these factors are things that you can change, such as:  Obesity.  Using products that contain nicotine or tobacco, such as cigarettes and e-cigarettes.  Taking oral birth control, especially if you also use tobacco.  Lack of physical inactivity.  Excessive use of alcohol.  Use of drugs, especially cocaine and methamphetamine. Other risk factors include:  High blood pressure (hypertension).  High cholesterol.  Diabetes mellitus.  Heart disease (coronary artery disease).  Atrial fibrillation.  Being African American or Hispanic.  Being over the age of 58.  Being female.  Family history of stroke.  Previous history of blood clots, stroke, TIA, or heart attack.  Sickle cell disease.  Being a woman with a history of preeclampsia.  Migraine headache.  Sleep apnea.  Chronic inflammatory diseases, such as rheumatoid arthritis or lupus.  Blood clotting disorders (hypercoagulable state). What are the signs or symptoms? Symptoms of this condition are the same as those of a stroke, but they are temporary. The symptoms develop suddenly, and they go away quickly, usually within minutes to hours. Symptoms may include sudden:  Weakness or numbness in your face, arm, or leg, especially on one side of your  body.  Trouble walking or difficulty moving your arms or legs.  Trouble speaking, understanding speech, or both (aphasia).  Vision changes in one or both eyes. These include double vision, blurred vision, or loss of vision.  Dizziness.  Confusion.  Loss of balance or coordination.  Nausea and vomiting.  Severe headache with no known cause. If possible, make note of the exact time that you last felt like your normal self and what time your symptoms started. Tell your health care provider. How is this diagnosed? This condition may be diagnosed based on:  Your symptoms and medical history.  A physical exam.  Imaging tests, usually a CT or MRI scan of the brain.  Blood tests. You may also have other tests, including:  Electrocardiogram (ECG).  Echocardiogram.  Carotid ultrasound.  A scan of the brain circulation (CT angiogram or MRI angiogram).  Continuous heart monitoring. How is this treated? The goal of treatment is to reduce the risk for a subsequent stroke. Treatment may include stroke prevention therapies such as:  Changes to diet or lifestyle to decrease your risk. Lifestyle changes may include exercising and stopping smoking.  Medicines to thin the blood (antiplatelets or anticoagulants).  Blood pressure medicines.  Medicines to reduce cholesterol.  Treating other health conditions, such as diabetes or atrial fibrillation. If testing shows that you have  narrowing in the arteries to your brain, your health care provider may recommend a procedure, such as:  Carotid endarterectomy. This is a surgery to remove the blockage from your artery.  Carotid angioplasty and stenting. This is a procedure to open or widen an artery in the neck using a metal mesh tube (stent). The stent helps keep the artery open by supporting the artery walls. Follow these instructions at home: Medicines   Take over-the-counter and prescription medicines only as told by your health  care provider.  If you were told to take a medicine to thin your blood, such as aspirin or an anticoagulant, take it exactly as told by your health care provider. ? Taking too much blood-thinning medicine can cause bleeding. ? If you do not take enough blood-thinning medicine, you will not have the protection that you need against a stroke and other problems. Eating and drinking   Eat 5 or more servings of fruits and vegetables each day.  Follow instructions from your health care provider about diet. You may need to follow a certain nutrition plan to help manage risk factors for stroke, such as high blood pressure, high cholesterol, diabetes, or obesity. This may include: ? Eating a low-fat, low-salt diet. ? Including a lot of fiber in your diet. ? Limiting the amount of carbohydrates and sugar in your diet.  Limit alcohol intake to no more than 1 drink a day for nonpregnant women and 2 drinks a day for men. One drink equals 12 oz of beer, 5 oz of wine, or 1 oz of hard liquor. General instructions  Maintain a healthy weight.  Stay physically active. Try to get at least 30 minutes of exercise on most or all days.  Find out if you have sleep apnea, and seek treatment if needed.  Do not use any products that contain nicotine or tobacco, such as cigarettes and e-cigarettes. If you need help quitting, ask your health care provider.  Do not abuse drugs.  Keep all follow-up visits as told by your health care provider. This is important. Where to find more information  American Stroke Association: www.strokeassociation.org  National Stroke Association: www.stroke.org Get help right away if:  You have chest pain or an irregular heartbeat.  You have any symptoms of stroke. The acronym BEFAST is an easy way to remember the main warning signs of stroke. ? B - Balance problems. Signs include dizziness, sudden trouble walking, or loss of balance. ? E - Eye problems. This includes trouble  seeing or a sudden change in vision. ? F - Face changes. This includes sudden weakness or numbness of the face, or the face or eyelid drooping to one side. ? A - Arm weakness or numbness. This happens suddenly and usually on one side of the body. ? S - Speech problems. This includes trouble speaking or trouble understanding speech. ? T - Time. Time to call 911 or seek emergency care. Do not wait to see if symptoms will go away. Make note of the time your symptoms started.  Other signs of stroke may include: ? A sudden, severe headache with no known cause. ? Nausea or vomiting. ? Seizure. These symptoms may represent a serious problem that is an emergency. Do not wait to see if the symptoms will go away. Get medical help right away. Call your local emergency services (911 in the [place] Do not drive yourself to the hospital. Summary  A TIA happens when an artery in the head or neck is  blocked, leading to stroke-like symptoms that then go away quickly. The blockage clears before there is any permanent brain damage. A TIA is a medical emergency and requires immediate medical attention.  Symptoms of this condition are the same as those of a stroke, but they are temporary. The symptoms usually develop suddenly, and they go away quickly, usually within minutes to hours.  Having a TIA means that you are at high risk of a stroke in the near future.  Treatment may include medicines to thin the blood as well as medicines, diet changes, and lifestyle changes to manage conditions that increase the risk of another TIA or a stroke. This information is not intended to replace advice given to you by your health care provider. Make sure you discuss any questions you have with your health care provider. Document Released: 07/13/2005 Document Revised: 04/10/2018 Document Reviewed: 11/15/2016 Elsevier Patient Education  2020 [employer].

## 2019-07-30 NOTE — Progress Notes (Addendum)
[facility]    Provider:  Dr Lucia Gaskins Requesting Provider: Meridee Score MD, emergency room Primary Care Provider:  Blair Heys, MD  CC:  Transient global amnesia  HPI:  Emily Lam is a 58 y.o. female here as requested by Sigmund Hazel, MD for altered mental status.  Past medical history hypertension and hypercholesterolemia. September 9th it was garbage day and she was cutting down limbs of the trees, she was stressed, she couldn't find anyone to cut it down, they started trimming limbs and they got up early 5am and working really hard trimming trees, she was on a ladder and she was going so hard trying to get a branch off she had to switch arms. She had furiously cut branches. She forgot what she was doing, she asked her husband what they were doing, she said to her husband "what tree", she doesn't rememer any of it and never had the memories back. She was confused that morning, doing strange things, not her normal routine, she remembers the car ride to work, got to work, went to [employer], she doesn't remember her colleague talking to her, husband called and she again said "what tree when he mentioned the tree, she stared at her phone at work. This is what she was told. She kept asking what the time was. No prior episodes. No seizure history in patient or family. She started remembering things after being in the emergency room at the end. No weakness, No aphasia, no hx of dementia in the family. No pain, no headache. Lasted < 24 hours. The nurse asked her why she was there and she said "I don't know".  Then resolved. No other focal neurologic deficits, associated symptoms, inciting events or modifiable factors.  Reviewed notes, labs and imaging from outside physicians, which showed:  I reviewed notes from the emergency room.  She was brought in by her husband for a period of time of memory loss.  Sometime around 7 AM till around 10 or 11 AM she was acting very.  She  could remember things that they had done at work she forgot to use her mask and could not remember that she was at work.  She has no recollection of face timing with her daughter.  No headache numbness weakness blurry vision nausea vomiting.  No recent illness.  No prior history of spells like this and no prior history of head injury or seizures.  She was back to baseline when she was seen.  Head CT and lab work fairly unremarkable.  Diagnosed with transient global amnesia.  CT head showed No acute intracranial abnormalities including mass lesion or mass effect, hydrocephalus, extra-axial fluid collection, midline shift, hemorrhage, or acute infarction, large ischemic events (personally reviewed images)   Review of Systems: Patient complains of symptoms per HPI as well as the following symptoms: stress. Pertinent negatives and positives per HPI. All others negative.   Social History   Socioeconomic History  . Marital status: Married    Spouse name: Not on file  . Number of children: 3  . Years of education: 65  . Highest education level: Not on file  Occupational History  . Occupation: [employer] Astr[employer]  . Financial resource strain: Not on file  . Food insecurity    Worry: Not on file    Inability: Not on file  . Transportation needs    Medical: Not on file    Non-medical: Not on file  Tobacco Use  . Smoking status:  Former Smoker  . Smokeless tobacco: Never Used  . Tobacco comment: smoked on and off in college   Substance and Sexual Activity  . Alcohol use: Yes    Comment: couple glasses of wine in the last 6 weeks  . Drug use: No  . Sexual activity: Not on file  Lifestyle  . Physical activity    Days per week: Not on file    Minutes per session: Not on file  . Stress: Not on file  Relationships  . Social Herbalist on phone: Not on file    Gets together: Not on file    Attends religious service: Not on file    Active member of club or organization:  Not on file    Attends meetings of clubs or organizations: Not on file    Relationship status: Not on file  . Intimate partner violence    Fear of current or ex partner: Not on file    Emotionally abused: Not on file    Physically abused: Not on file    Forced sexual activity: Not on file  Other Topics Concern  . Not on file  Social History Narrative   Lives at home w/ her husband   Right-handed   Caffeine: 2 cups of coffee per day    Family History  Problem Relation Age of Onset  . Diabetes Father        Agent orange  . Neuropathy Father   . Breast cancer Maternal Grandmother     Past Medical History:  Diagnosis Date  . Chronic kidney disease    left distal ureter stone  . High cholesterol   . Hypertension     Patient Active Problem List   Diagnosis Date Noted  . HTN (hypertension) 07/30/2019  . Hypercholesterolemia 07/30/2019  . Transient global amnesia 07/30/2019    Past Surgical History:  Procedure Laterality Date  . AUGMENTATION MAMMAPLASTY Bilateral 10/2014   20 yr ago Replaced with saline two years ago Jan of 2016  . PLACEMENT OF BREAST IMPLANTS Bilateral 10/2014   Behind the mucsle implants done in 10/2014 which replaced the In front of the muscle silicone gel implants done in March of 1991  . TUBAL LIGATION      Current Outpatient Medications  Medication Sig Dispense Refill  . guaiFENesin (MUCINEX PO) Take 1 tablet by mouth at bedtime as needed (allergies).    . loratadine (CLARITIN) 10 MG tablet Take 10 mg by mouth daily.    . Red Yeast Rice Extract (RED YEAST RICE PO) Take 600 mg by mouth 2 (two) times daily.    Marland Kitchen ALPRAZolam (XANAX) 0.25 MG tablet Take 1-2 tabs (0.25mg -0.50mg ) 30-60 minutes before procedure. May repeat if needed.Do not drive. 10 tablet 0   No current facility-administered medications for this visit.     Allergies as of 07/30/2019  . (No Known Allergies)    Vitals: BP (!) 194/80 (BP Location: Right Arm, Patient Position:  Sitting)   Pulse 81   Temp (!) 97.2 F (36.2 C) Comment: taken at front door  Ht 5\' 6"  (1.676 m)   Wt 137 lb (62.1 kg)   BMI 22.11 kg/m  Last Weight:  Wt Readings from Last 1 Encounters:  07/30/19 137 lb (62.1 kg)   Last Height:   Ht Readings from Last 1 Encounters:  07/30/19 5\' 6"  (1.676 m)     Physical exam: Exam: Gen: NAD, conversant, well nourised, well groomed  CV: RRR, no MRG. No Carotid Bruits. No peripheral edema, warm, nontender Eyes: Conjunctivae clear without exudates or hemorrhage  Neuro: Detailed Neurologic Exam  Speech:    Speech is normal; fluent and spontaneous with normal comprehension.  Cognition:    The patient is oriented to person, place, and time;     recent and remote memory intact;     language fluent;     normal attention, concentration,     fund of knowledge Cranial Nerves:    The pupils are equal, round, and reactive to light. The fundi are normal and spontaneous venous pulsations are present. Visual fields are full to finger confrontation. Extraocular movements are intact. Trigeminal sensation is intact and the muscles of mastication are normal. The face is symmetric. The palate elevates in the midline. Hearing intact. Voice is normal. Shoulder shrug is normal. The tongue has normal motion without fasciculations.   Coordination:    No dysmetria  Gait:    Normal native gait  Motor Observation:    No asymmetry, no atrophy, and no involuntary movements noted. Tone:    Normal muscle tone.    Posture:    Posture is normal. normal erect    Strength:    Strength is V/V in the upper and lower limbs.      Sensation: intact to LT     Reflex Exam:  DTR's:    Deep tendon reflexes in the upper and lower extremities are normal bilaterally.   Toes:    The toes are downgoing bilaterally.   Clonus:    Clonus is absent.    Assessment/Plan:  Lovely patient with an episode of memory loss for less than 24 hours. Likely TGA.  But differential includes Transient Global Amnesia vs TIA vs Seizure. Recommend stroke workup and EEG.  Needs a thorough evaluation.  Goal LDL<70 and hgbaic goal normal, she has a yearly physical with Dr. Manus GunningEhinger discussed having him take these (lipid panel and hgba1c).   Reviewed labs 07/31/2019(addendum): CMP BUN 11 Creat .68, LDL 182, hgba1c 5.4  Orders Placed This Encounter  Procedures  . MR BRAIN W WO CONTRAST  . CT ANGIO HEAD W OR WO CONTRAST  . CT ANGIO NECK W OR WO CONTRAST  . ECHOCARDIOGRAM COMPLETE BUBBLE STUDY  . EEG   Meds ordered this encounter  Medications  . ALPRAZolam (XANAX) 0.25 MG tablet    Sig: Take 1-2 tabs (0.25mg -0.50mg ) 30-60 minutes before procedure. May repeat if needed.Do not drive.    Dispense:  10 tablet    Refill:  0    Cc:  Blair HeysEhinger, Robert, MD  Naomie DeanAntonia Pattye Meda, MD  [facility] [address] [place], [place] [id number]  Phone [phone] Fax [phone]

## 2019-07-31 ENCOUNTER — Telehealth: Payer: Self-pay | Admitting: Neurology

## 2019-07-31 ENCOUNTER — Other Ambulatory Visit: Payer: Self-pay | Admitting: Family Medicine

## 2019-07-31 DIAGNOSIS — R030 Elevated blood-pressure reading, without diagnosis of hypertension: Secondary | ICD-10-CM | POA: Diagnosis not present

## 2019-07-31 DIAGNOSIS — Z23 Encounter for immunization: Secondary | ICD-10-CM | POA: Diagnosis not present

## 2019-07-31 DIAGNOSIS — Z0000 Encounter for general adult medical examination without abnormal findings: Secondary | ICD-10-CM | POA: Diagnosis not present

## 2019-07-31 DIAGNOSIS — Z131 Encounter for screening for diabetes mellitus: Secondary | ICD-10-CM | POA: Diagnosis not present

## 2019-07-31 DIAGNOSIS — E7800 Pure hypercholesterolemia, unspecified: Secondary | ICD-10-CM | POA: Diagnosis not present

## 2019-07-31 DIAGNOSIS — Z1231 Encounter for screening mammogram for malignant neoplasm of breast: Secondary | ICD-10-CM

## 2019-07-31 NOTE — Telephone Encounter (Signed)
I am looking for carotid stenosis or dissection as a cause of TIA (embolic) thanks

## 2019-07-31 NOTE — Telephone Encounter (Signed)
I can get the CT Angio head approved and the MRI Brain. But for the CT Angio Neck what are you ruling out.

## 2019-08-01 ENCOUNTER — Telehealth: Payer: Self-pay | Admitting: Neurology

## 2019-08-01 ENCOUNTER — Encounter (HOSPITAL_COMMUNITY): Payer: Self-pay | Admitting: Neurology

## 2019-08-01 NOTE — Telephone Encounter (Signed)
error 

## 2019-08-01 NOTE — Telephone Encounter (Signed)
Noted, MR Brain auth: [id number] (exp. 08/01/19 to 09/29/19)  CT Angio Head and Neck Auth: [id number] (exp. 08/01/19 to 09/29/19)   Order sent to GI they will reach out to the patient to schedule.

## 2019-08-06 ENCOUNTER — Telehealth (HOSPITAL_COMMUNITY): Payer: Self-pay

## 2019-08-06 NOTE — Telephone Encounter (Signed)
New message    Just an FYI. We have made several attempts to contact this patient including sending a letter to schedule or reschedule their echocardiogram. We will be removing the patient from the echo WQ.   10.20.20 @ 3:37pm both #  are the same - Trentin Knappenberger   10.15.20 mail reminder letter Jazir Newey  10.13.20 @ 10:38am both # are the same lm on home vm  - Lavone Weisel

## 2019-08-07 NOTE — Telephone Encounter (Signed)
Emily Lam, can you call her once more please? thanks

## 2019-08-07 NOTE — Telephone Encounter (Signed)
Patient went to her PCP Ehinger  The following day after she say you and he did all of her blood work. Everything was fine but High cholesterol . Patient states she is going to decline all test at this time. Patient will call back if she need's [place]. Dr. Jaynee Eagles Patient thanks you.

## 2019-08-15 ENCOUNTER — Other Ambulatory Visit: Payer: BC Managed Care – PPO

## 2019-08-29 DIAGNOSIS — Z1211 Encounter for screening for malignant neoplasm of colon: Secondary | ICD-10-CM | POA: Diagnosis not present

## 2019-09-18 ENCOUNTER — Other Ambulatory Visit: Payer: Self-pay

## 2019-09-18 ENCOUNTER — Ambulatory Visit
Admission: RE | Admit: 2019-09-18 | Discharge: 2019-09-18 | Disposition: A | Discharge status: Home / Self Care | Payer: BC Managed Care – PPO | Source: Ambulatory Visit | Attending: Family Medicine | Admitting: Family Medicine

## 2019-09-18 DIAGNOSIS — Z1231 Encounter for screening mammogram for malignant neoplasm of breast: Secondary | ICD-10-CM | POA: Diagnosis not present

## 2019-09-19 ENCOUNTER — Other Ambulatory Visit: Payer: Self-pay | Admitting: Family Medicine

## 2019-09-19 DIAGNOSIS — R928 Other abnormal and inconclusive findings on diagnostic imaging of breast: Secondary | ICD-10-CM

## 2019-09-24 ENCOUNTER — Ambulatory Visit
Admission: RE | Admit: 2019-09-24 | Discharge: 2019-09-24 | Disposition: A | Discharge status: Home / Self Care | Payer: BC Managed Care – PPO | Source: Ambulatory Visit | Attending: Family Medicine | Admitting: Family Medicine

## 2019-09-24 ENCOUNTER — Other Ambulatory Visit: Payer: Self-pay

## 2019-09-24 ENCOUNTER — Other Ambulatory Visit: Payer: Self-pay | Admitting: Family Medicine

## 2019-09-24 DIAGNOSIS — R921 Mammographic calcification found on diagnostic imaging of breast: Secondary | ICD-10-CM | POA: Diagnosis not present

## 2019-09-24 DIAGNOSIS — R928 Other abnormal and inconclusive findings on diagnostic imaging of breast: Secondary | ICD-10-CM

## 2021-07-20 IMAGING — MG MM DIGITAL DIAGNOSTIC UNILAT*R* W/ IMPLANTS
4 series · 4 of 8 positions shown · non-contrast
Comparison: Previous exam(s).

CLINICAL DATA: Patient presents as a recall from screen for
possible right breast calcifications.

EXAM:
DIGITAL DIAGNOSTIC RIGHT MAMMOGRAM

[R ML (1 of 2)]
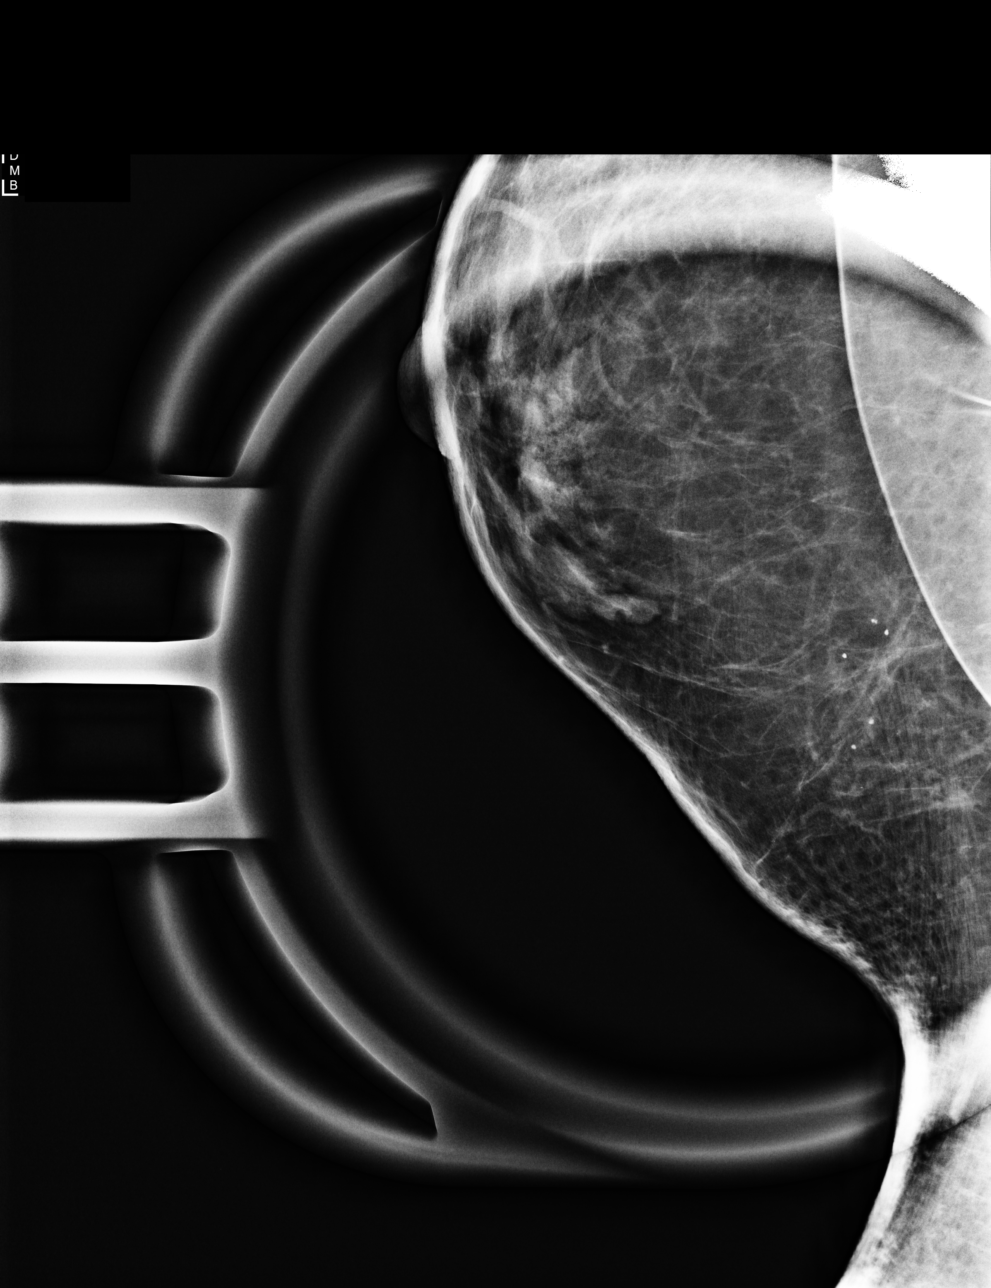

[R ML (2 of 2)]
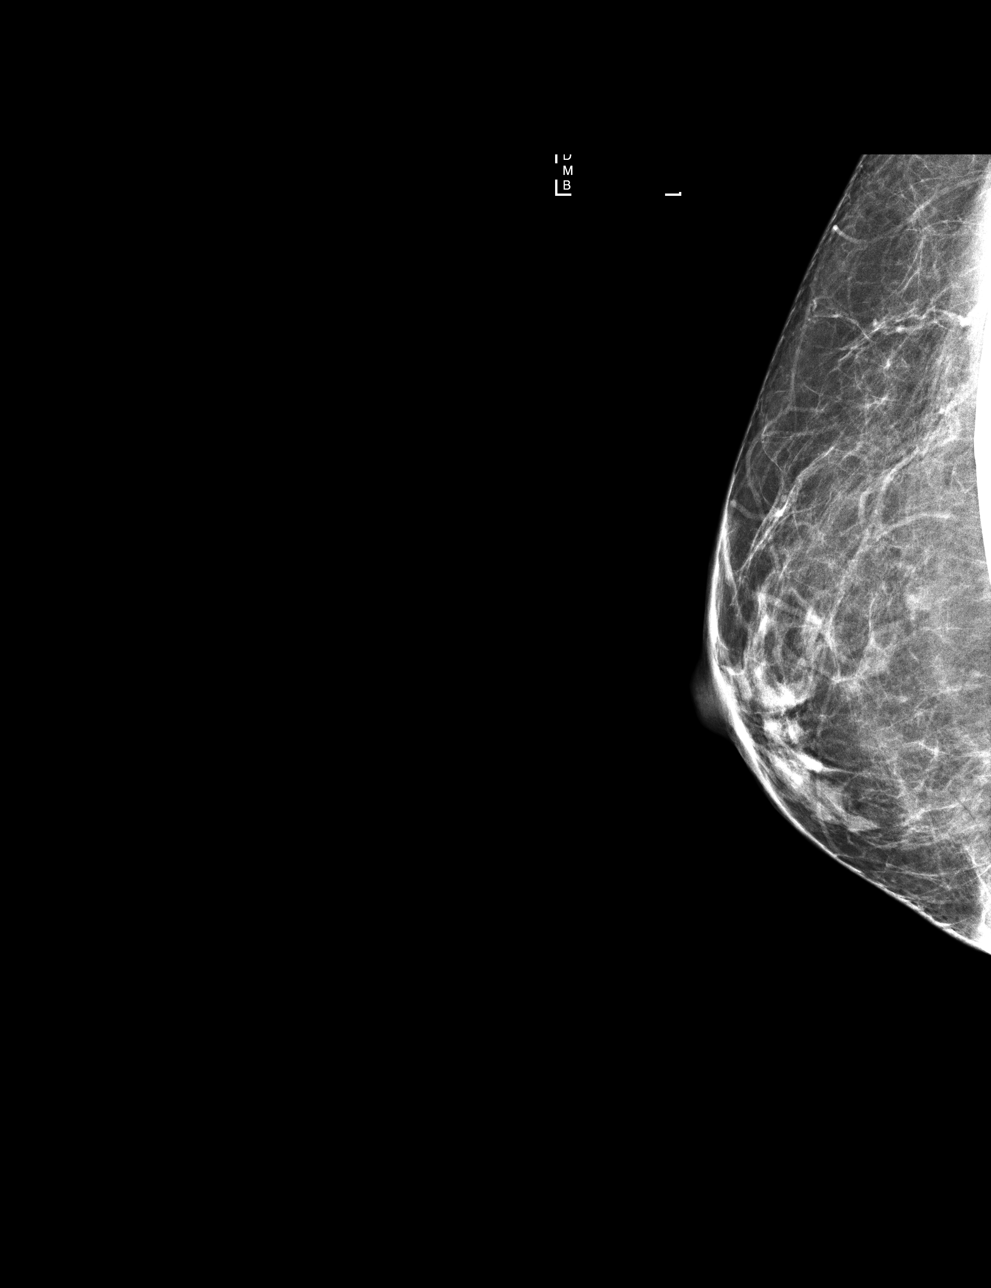

[R ML synth-2D]
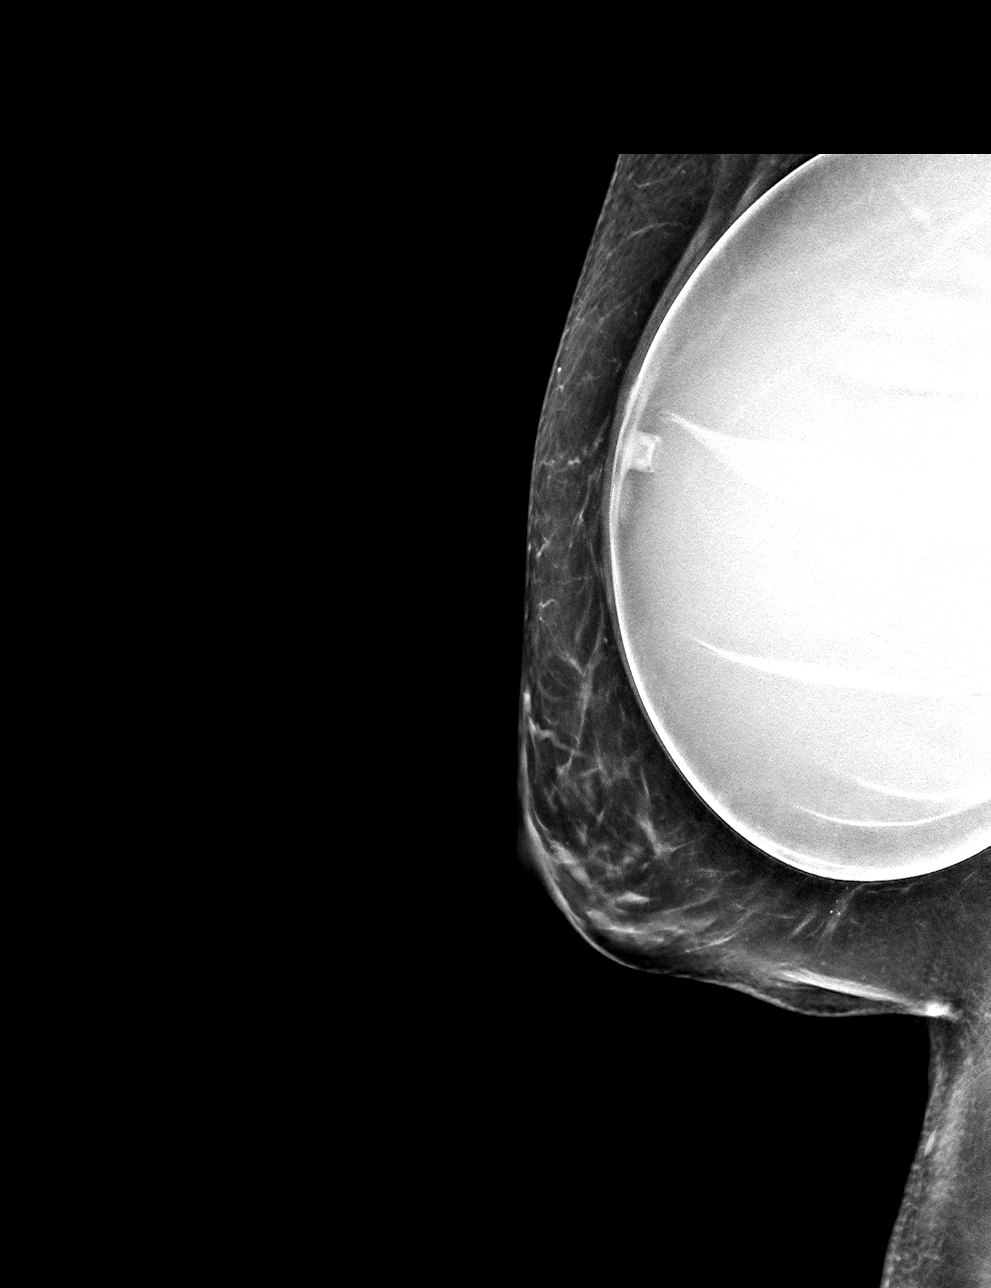

[R ML tomo · tomo slice 45/89.0]
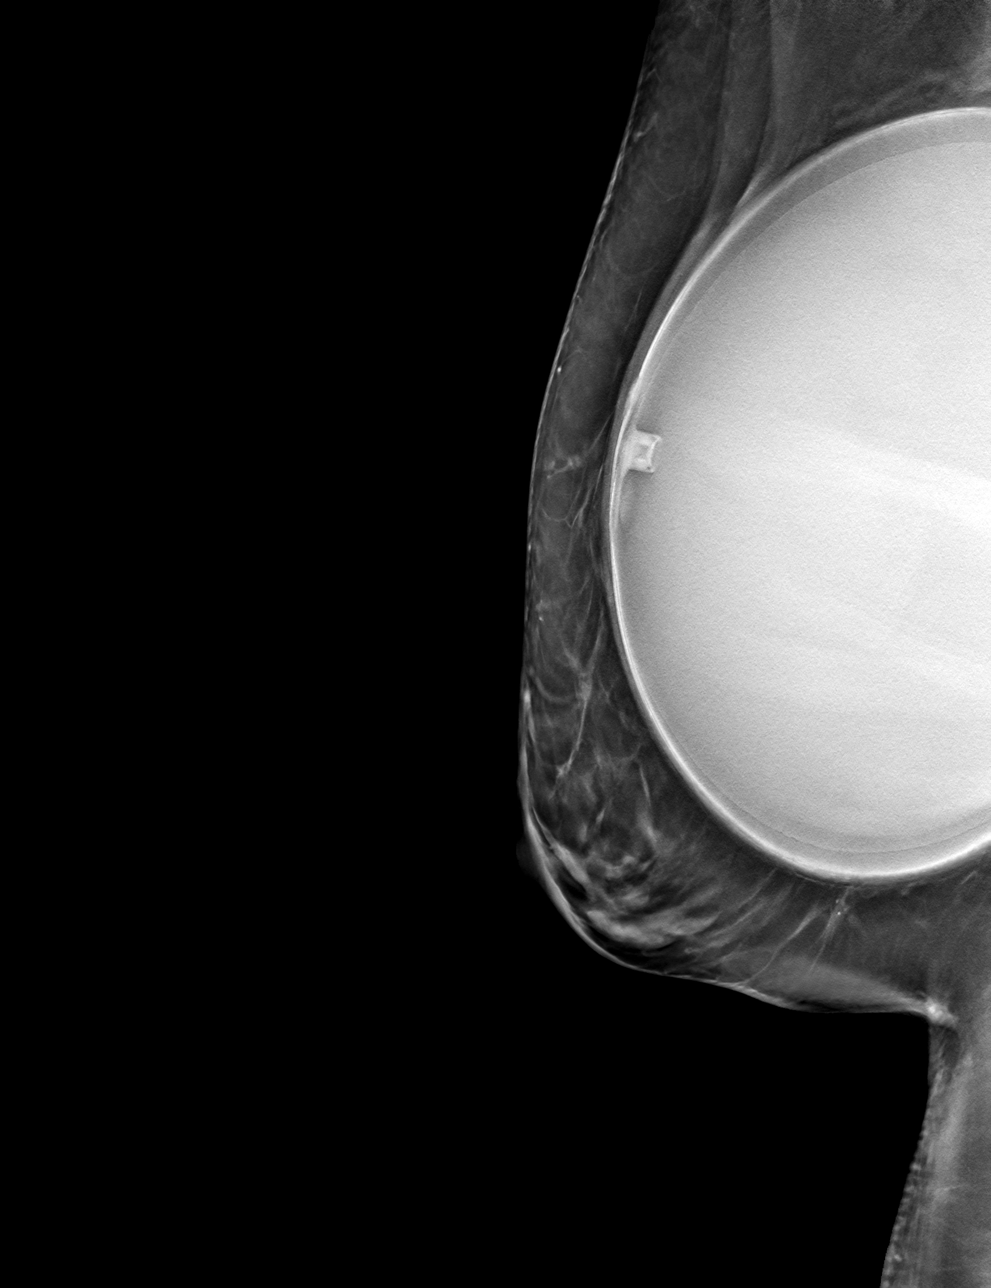

[4 of 8 positions shown; findings below may reference images not displayed]

ACR Breast Density Category b: There are scattered areas of
fibroglandular density.
FINDINGS: Spot magnification and full field mL views were performed for the
questioned calcifications in the inferior right breast. On the spot
magnification views there are approximately 5 loosely grouped round
calcifications spanning 1.3 cm. No associated mass or distortion
identified.
IMPRESSION: Right breast calcifications located inferiorly spanning 1.3 cm are
probably benign.

RECOMMENDATION:
Diagnostic right breast mammogram in 6 months to confirm stability
of the probably benign calcifications.

I have discussed the findings and recommendations with the patient.
If applicable, a reminder letter will be sent to the patient
regarding the next appointment.

BI-RADS CATEGORY  3: Probably benign.
